# Patient Record
Sex: Female | Born: 1955 | Race: White | Hispanic: No | Smoking: Current every day smoker
Health system: Southern US, Community
[De-identification: ages and names within clinical notes are randomized; demographics above are authoritative.]

## PROBLEM LIST (undated history)

## (undated) DIAGNOSIS — F419 Anxiety disorder, unspecified: Secondary | ICD-10-CM

## (undated) DIAGNOSIS — R197 Diarrhea, unspecified: Secondary | ICD-10-CM

## (undated) DIAGNOSIS — Q899 Congenital malformation, unspecified: Secondary | ICD-10-CM

## (undated) DIAGNOSIS — M1990 Unspecified osteoarthritis, unspecified site: Secondary | ICD-10-CM

## (undated) DIAGNOSIS — J40 Bronchitis, not specified as acute or chronic: Secondary | ICD-10-CM

## (undated) HISTORY — PX: COLONOSCOPY W/ POLYPECTOMY: SHX1380

---

## 2013-05-05 DIAGNOSIS — J40 Bronchitis, not specified as acute or chronic: Secondary | ICD-10-CM

## 2013-05-05 HISTORY — DX: Bronchitis, not specified as acute or chronic: J40

## 2013-05-31 ENCOUNTER — Other Ambulatory Visit: Payer: Self-pay | Admitting: Orthopedic Surgery

## 2013-06-01 ENCOUNTER — Other Ambulatory Visit: Payer: Self-pay | Admitting: Orthopedic Surgery

## 2013-06-04 ENCOUNTER — Encounter (HOSPITAL_COMMUNITY): Payer: Self-pay | Admitting: Pharmacy Technician

## 2013-06-07 ENCOUNTER — Encounter (HOSPITAL_COMMUNITY)
Admission: RE | Admit: 2013-06-07 | Discharge: 2013-06-07 | Disposition: A | Discharge status: Home / Self Care | Payer: Federal, State, Local not specified - PPO | Source: Ambulatory Visit | Attending: Orthopedic Surgery | Admitting: Orthopedic Surgery

## 2013-06-07 ENCOUNTER — Encounter (HOSPITAL_COMMUNITY): Payer: Self-pay

## 2013-06-07 ENCOUNTER — Encounter (INDEPENDENT_AMBULATORY_CARE_PROVIDER_SITE_OTHER): Payer: Self-pay

## 2013-06-07 HISTORY — DX: Unspecified osteoarthritis, unspecified site: M19.90

## 2013-06-07 HISTORY — DX: Anxiety disorder, unspecified: F41.9

## 2013-06-07 HISTORY — DX: Bronchitis, not specified as acute or chronic: J40

## 2013-06-07 HISTORY — DX: Congenital malformation, unspecified: Q89.9

## 2013-06-07 HISTORY — DX: Diarrhea, unspecified: R19.7

## 2013-06-07 LAB — COMPREHENSIVE METABOLIC PANEL
ALBUMIN: 4.2 g/dL (ref 3.5–5.2)
ALK PHOS: 92 U/L (ref 39–117)
ALT: 20 U/L (ref 0–35)
AST: 23 U/L (ref 0–37)
BUN: 8 mg/dL (ref 6–23)
CO2: 20 mEq/L (ref 19–32)
Calcium: 9.7 mg/dL (ref 8.4–10.5)
Chloride: 101 mEq/L (ref 96–112)
Creatinine, Ser: 0.57 mg/dL (ref 0.50–1.10)
GFR calc non Af Amer: >90 mL/min (ref >90)
Glucose, Bld: 127 mg/dL — ABNORMAL HIGH (ref 70–99)
POTASSIUM: 4.5 meq/L (ref 3.7–5.3)
SODIUM: 139 meq/L (ref 137–147)
TOTAL PROTEIN: 8 g/dL (ref 6.0–8.3)
Total Bilirubin: 0.3 mg/dL (ref 0.3–1.2)

## 2013-06-07 LAB — PROTIME-INR
INR: 1.03 (ref 0.00–1.49)
Prothrombin Time: 13.3 seconds (ref 11.6–15.2)

## 2013-06-07 LAB — URINALYSIS, ROUTINE W REFLEX MICROSCOPIC
Bilirubin Urine: NEGATIVE
Glucose, UA: NEGATIVE mg/dL
Hgb urine dipstick: NEGATIVE
Ketones, ur: NEGATIVE mg/dL
Leukocytes, UA: NEGATIVE
NITRITE: NEGATIVE
Protein, ur: NEGATIVE mg/dL
SPECIFIC GRAVITY, URINE: 1.008 (ref 1.005–1.030)
UROBILINOGEN UA: 0.2 mg/dL (ref 0.0–1.0)
pH: 6.5 (ref 5.0–8.0)

## 2013-06-07 LAB — CBC WITH DIFFERENTIAL/PLATELET
Basophils Absolute: 0 10*3/uL (ref 0.0–0.1)
Basophils Relative: 0 % (ref 0–1)
EOS ABS: 0.1 10*3/uL (ref 0.0–0.7)
Eosinophils Relative: 1 % (ref 0–5)
HCT: 39.9 % (ref 36.0–46.0)
Hemoglobin: 13.7 g/dL (ref 12.0–15.0)
LYMPHS ABS: 1.9 10*3/uL (ref 0.7–4.0)
Lymphocytes Relative: 19 % (ref 12–46)
MCH: 32.4 pg (ref 26.0–34.0)
MCHC: 34.3 g/dL (ref 30.0–36.0)
MCV: 94.3 fL (ref 78.0–100.0)
Monocytes Absolute: 0.6 10*3/uL (ref 0.1–1.0)
Monocytes Relative: 6 % (ref 3–12)
NEUTROS ABS: 7.2 10*3/uL (ref 1.7–7.7)
NEUTROS PCT: 74 % (ref 43–77)
PLATELETS: 227 10*3/uL (ref 150–400)
RBC: 4.23 MIL/uL (ref 3.87–5.11)
RDW: 13.4 % (ref 11.5–15.5)
WBC: 9.9 10*3/uL (ref 4.0–10.5)

## 2013-06-07 LAB — TYPE AND SCREEN
ABO/RH(D): O POS
ANTIBODY SCREEN: NEGATIVE

## 2013-06-07 LAB — SURGICAL PCR SCREEN
MRSA, PCR: NEGATIVE
Staphylococcus aureus: NEGATIVE

## 2013-06-07 LAB — ABO/RH: ABO/RH(D): O POS

## 2013-06-07 LAB — APTT: aPTT: 27 seconds (ref 24–37)

## 2013-06-07 NOTE — Pre-Procedure Instructions (Signed)
Linda Rivera  06/07/2013   Your procedure is scheduled on:  Thursday Jun 10, 2013 at 7:30 AM.  [facility]"  Admitting at 5:30 AM.  Call this number if you have problems the morning of surgery: [phone]   Remember:   Do not eat food or drink liquids after midnight.   Take these medicines the morning of surgery with A SIP OF WATER: Clonazepam (Klonopin) if needed for anxiety, Gabapentin (Neurontin), Hydrocodone (Vicodin) if needed for pain.    Do not wear jewelry, make-up or nail polish.  Do not wear lotions, powders, or perfumes.   Do not shave 48 hours prior to surgery.   Do not bring valuables to the hospital.  [facility] is not responsible for any belongings or valuables.               Contacts, dentures or bridgework may not be worn into surgery.  Leave suitcase in the car. After surgery it may be brought to your room.  For patients admitted to the hospital, discharge time is determined by your treatment team.               Patients discharged the day of surgery will not be allowed to drive home.  Name and phone number of your driver: Family/Friend  Special Instructions: Shower the night before and the morning of your surgery using CHG soap   Please read over the following fact sheets that you were given: Pain Booklet, Coughing and Deep Breathing, Blood Transfusion Information, MRSA Information and Surgical Site Infection Prevention

## 2013-06-07 NOTE — Progress Notes (Signed)
Patient denied having any cardiac issues but informed Nurse that she had bronchitis approximately 2 weeks ago and has finished antibiotics. Patient denied having any shortness of breath. PCP is Corinna CapraAngel Brown in [place].

## 2013-06-09 MED ORDER — CEFAZOLIN SODIUM-DEXTROSE 2-3 GM-% IV SOLR
2.0000 g | INTRAVENOUS | Status: AC
  Administered 2013-06-10: 2 g via INTRAVENOUS

## 2013-06-09 MED ORDER — POVIDONE-IODINE 7.5 % EX SOLN
Freq: Once | CUTANEOUS | Status: DC
  Filled 2013-06-09: qty 118

## 2013-06-09 NOTE — H&P (Signed)
PREOPERATIVE H&P  Chief Complaint: left leg pain  HPI: Linda EhlersMary Rivera is a 58 y.o. female who presents with ongoing pain the left leg x close to 1 year. 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 Given the patient ongoing pain and disability, she did elect to proceed with a, L4/5 decompression and fusion.   Past Medical History  Diagnosis Date  . Bronchitis April 2015  . Arthritis   . Anxiety   . Diarrhea   . Birth defect     Right knee   Past Surgical History  Procedure Laterality Date  . Colonoscopy w/ polypectomy     History   Social History  . Marital Status: Widowed    Spouse Name: N/A    Number of Children: N/A  . Years of Education: N/A   Social History Main Topics  . Smoking status: Current Every Day Smoker -- 1.00 packs/day for 26 years    Types: Cigarettes  . Smokeless tobacco: Not on file  . Alcohol Use: No  . Drug Use: No  . Sexual Activity: Not on file   Other Topics Concern  . Not on file   Social History Narrative  . No narrative on file   No family history on file. Allergies  Allergen Reactions  . Darvon [Propoxyphene] Hives   Prior to Admission medications   Medication Sig Start Date End Date Taking? Authorizing Provider  aspirin 81 MG chewable tablet Chew 81 mg by mouth every other day.   Yes Historical Provider, MD  clonazePAM (KLONOPIN) 0.5 MG tablet Take 0.5 mg by mouth 2 (two) times daily as needed for anxiety.   Yes Historical Provider, MD  diphenoxylate-atropine (LOMOTIL) 2.5-0.025 MG per tablet Take 1 tablet by mouth 4 (four) times daily as needed for diarrhea or loose stools.   Yes Historical Provider, MD  gabapentin (NEURONTIN) 300 MG capsule Take 300 mg by mouth 3 (three) times daily.   Yes Historical Provider, MD  HYDROcodone-acetaminophen (NORCO/VICODIN) 5-325 MG per tablet Take 1 tablet by mouth every 4 (four) hours as needed for moderate pain.   Yes Historical Provider, MD   phenyltoloxamine-acetaminophen 30-325 MG per tablet Take 1 tablet by mouth 2 (two) times daily.   Yes Historical Provider, MD  sertraline (ZOLOFT) 25 MG tablet Take 25 mg by mouth daily.   Yes Historical Provider, MD     All other systems have been reviewed and were otherwise negative with the exception of those mentioned in the HPI and as above.  Physical Exam: There were no vitals filed for this visit.  General: Alert, no acute distress Cardiovascular: No pedal edema Respiratory: No cyanosis, no use of accessory musculature GI: No organomegaly, abdomen is soft and non-tender Skin: No lesions in the area of chief complaint Neurologic: Sensation intact distally Psychiatric: Patient is competent for consent with normal mood and affect Lymphatic: No axillary or cervical lymphadenopathy  MUSCULOSKELETAL: + SLR on left  Assessment/Plan: Left leg pain Plan for Procedure(s): POSTERIOR LUMBAR DECOMPRESSION AND FUSION 1 LEVEL   Emilee HeroMark Leonard Felise Georgia, MD 06/09/2013 4:21 PM

## 2013-06-10 ENCOUNTER — Inpatient Hospital Stay (HOSPITAL_COMMUNITY): Payer: Federal, State, Local not specified - PPO

## 2013-06-10 ENCOUNTER — Encounter (HOSPITAL_COMMUNITY): Payer: Self-pay | Admitting: *Deleted

## 2013-06-10 ENCOUNTER — Encounter (HOSPITAL_COMMUNITY): Payer: Federal, State, Local not specified - PPO | Admitting: Certified Registered Nurse Anesthetist

## 2013-06-10 ENCOUNTER — Encounter (HOSPITAL_COMMUNITY)
Admission: RE | Disposition: A | Discharge status: Home / Self Care | Payer: Federal, State, Local not specified - PPO | Source: Ambulatory Visit | Attending: Orthopedic Surgery

## 2013-06-10 ENCOUNTER — Inpatient Hospital Stay (HOSPITAL_COMMUNITY)
Admission: RE | Admit: 2013-06-10 | Discharge: 2013-06-12 | DRG: 460 | Disposition: A | Discharge status: Home / Self Care | Payer: Federal, State, Local not specified - PPO | Source: Ambulatory Visit | Attending: Orthopedic Surgery | Admitting: Orthopedic Surgery

## 2013-06-10 ENCOUNTER — Inpatient Hospital Stay (HOSPITAL_COMMUNITY): Payer: Federal, State, Local not specified - PPO | Admitting: Certified Registered Nurse Anesthetist

## 2013-06-10 DIAGNOSIS — Z01812 Encounter for preprocedural laboratory examination: Secondary | ICD-10-CM

## 2013-06-10 DIAGNOSIS — G8918 Other acute postprocedural pain: Secondary | ICD-10-CM | POA: Diagnosis not present

## 2013-06-10 DIAGNOSIS — M5410 Radiculopathy, site unspecified: Secondary | ICD-10-CM | POA: Diagnosis present

## 2013-06-10 DIAGNOSIS — F411 Generalized anxiety disorder: Secondary | ICD-10-CM | POA: Diagnosis present

## 2013-06-10 DIAGNOSIS — F17200 Nicotine dependence, unspecified, uncomplicated: Secondary | ICD-10-CM | POA: Diagnosis present

## 2013-06-10 DIAGNOSIS — Q762 Congenital spondylolisthesis: Principal | ICD-10-CM

## 2013-06-10 SURGERY — POSTERIOR LUMBAR FUSION 1 LEVEL
Anesthesia: General | Site: Back | Laterality: Left

## 2013-06-10 MED ORDER — OXYCODONE-ACETAMINOPHEN 5-325 MG PO TABS
1.0000 | ORAL_TABLET | ORAL | Status: DC | PRN
  Administered 2013-06-11 – 2013-06-12 (×4): 2 via ORAL
  Filled 2013-06-10: qty 2
  Filled 2013-06-10: qty 1
  Filled 2013-06-10 (×3): qty 2

## 2013-06-10 MED ORDER — OXYCODONE HCL 5 MG/5ML PO SOLN: 5.0000 mg | Freq: Once | ORAL | Status: AC | PRN

## 2013-06-10 MED ORDER — FENTANYL CITRATE 0.05 MG/ML IJ SOLN
INTRAMUSCULAR | Status: AC
  Filled 2013-06-10: qty 5

## 2013-06-10 MED ORDER — ONDANSETRON HCL 4 MG/2ML IJ SOLN: 4.0000 mg | Freq: Once | INTRAMUSCULAR | Status: DC | PRN

## 2013-06-10 MED ORDER — SUCCINYLCHOLINE CHLORIDE 20 MG/ML IJ SOLN
INTRAMUSCULAR | Status: AC
  Filled 2013-06-10: qty 1

## 2013-06-10 MED ORDER — BUPIVACAINE-EPINEPHRINE (PF) 0.25% -1:200000 IJ SOLN
INTRAMUSCULAR | Status: AC
  Filled 2013-06-10: qty 30

## 2013-06-10 MED ORDER — BUPIVACAINE-EPINEPHRINE 0.25% -1:200000 IJ SOLN
INTRAMUSCULAR | Status: DC | PRN
  Administered 2013-06-10: 27 mL

## 2013-06-10 MED ORDER — PHENYLEPHRINE HCL 10 MG/ML IJ SOLN
INTRAMUSCULAR | Status: DC | PRN
  Administered 2013-06-10 (×3): 160 ug via INTRAVENOUS

## 2013-06-10 MED ORDER — EPHEDRINE SULFATE 50 MG/ML IJ SOLN
INTRAMUSCULAR | Status: DC | PRN
  Administered 2013-06-10: 15 mg via INTRAVENOUS
  Administered 2013-06-10: 10 mg via INTRAVENOUS

## 2013-06-10 MED ORDER — DIPHENHYDRAMINE HCL 12.5 MG/5ML PO ELIX: 12.5000 mg | ORAL_SOLUTION | Freq: Four times a day (QID) | ORAL | Status: DC | PRN

## 2013-06-10 MED ORDER — MENTHOL 3 MG MT LOZG: 1.0000 | LOZENGE | OROMUCOSAL | Status: DC | PRN

## 2013-06-10 MED ORDER — OXYCODONE HCL 5 MG PO TABS
5.0000 mg | ORAL_TABLET | Freq: Once | ORAL | Status: AC | PRN
  Administered 2013-06-10: 5 mg via ORAL

## 2013-06-10 MED ORDER — NEOSTIGMINE METHYLSULFATE 10 MG/10ML IV SOLN
INTRAVENOUS | Status: DC | PRN
  Administered 2013-06-10: 3 mg via INTRAVENOUS

## 2013-06-10 MED ORDER — KETOROLAC TROMETHAMINE 30 MG/ML IJ SOLN
INTRAMUSCULAR | Status: AC
  Filled 2013-06-10: qty 1

## 2013-06-10 MED ORDER — CEFAZOLIN SODIUM 1-5 GM-% IV SOLN
1.0000 g | Freq: Three times a day (TID) | INTRAVENOUS | Status: AC
  Administered 2013-06-10 – 2013-06-11 (×2): 1 g via INTRAVENOUS
  Filled 2013-06-10 (×2): qty 50

## 2013-06-10 MED ORDER — METHYLENE BLUE 1 % INJ SOLN
INTRAMUSCULAR | Status: AC
  Filled 2013-06-10: qty 10

## 2013-06-10 MED ORDER — THROMBIN 20000 UNITS EX SOLR
CUTANEOUS | Status: DC | PRN
  Administered 2013-06-10: 09:00:00 via TOPICAL

## 2013-06-10 MED ORDER — ALBUMIN HUMAN 5 % IV SOLN
INTRAVENOUS | Status: DC | PRN
  Administered 2013-06-10 (×2): via INTRAVENOUS

## 2013-06-10 MED ORDER — HYDROMORPHONE HCL PF 1 MG/ML IJ SOLN
INTRAMUSCULAR | Status: AC
  Filled 2013-06-10: qty 1

## 2013-06-10 MED ORDER — PROPOFOL 10 MG/ML IV BOLUS
INTRAVENOUS | Status: DC | PRN
  Administered 2013-06-10: 40 mg via INTRAVENOUS
  Administered 2013-06-10: 160 mg via INTRAVENOUS

## 2013-06-10 MED ORDER — MORPHINE SULFATE (PF) 1 MG/ML IV SOLN
INTRAVENOUS | Status: AC
  Filled 2013-06-10: qty 25

## 2013-06-10 MED ORDER — THROMBIN 20000 UNITS EX SOLR
CUTANEOUS | Status: AC
  Filled 2013-06-10: qty 20000

## 2013-06-10 MED ORDER — SODIUM CHLORIDE 0.9 % IJ SOLN
3.0000 mL | Freq: Two times a day (BID) | INTRAMUSCULAR | Status: DC
  Administered 2013-06-11 (×2): 3 mL via INTRAVENOUS

## 2013-06-10 MED ORDER — BISACODYL 5 MG PO TBEC: 5.0000 mg | DELAYED_RELEASE_TABLET | Freq: Every day | ORAL | Status: DC | PRN

## 2013-06-10 MED ORDER — GLYCOPYRROLATE 0.2 MG/ML IJ SOLN
INTRAMUSCULAR | Status: DC | PRN
  Administered 2013-06-10: 0.6 mg via INTRAVENOUS

## 2013-06-10 MED ORDER — PHENOL 1.4 % MT LIQD: 1.0000 | OROMUCOSAL | Status: DC | PRN

## 2013-06-10 MED ORDER — LIDOCAINE HCL (CARDIAC) 20 MG/ML IV SOLN
INTRAVENOUS | Status: DC | PRN
  Administered 2013-06-10: 100 mg via INTRAVENOUS

## 2013-06-10 MED ORDER — METHYLENE BLUE 1 % INJ SOLN
INTRAMUSCULAR | Status: DC | PRN
  Administered 2013-06-10: 1 mL

## 2013-06-10 MED ORDER — SERTRALINE HCL 25 MG PO TABS
25.0000 mg | ORAL_TABLET | Freq: Every day | ORAL | Status: DC
  Administered 2013-06-11 – 2013-06-12 (×2): 25 mg via ORAL
  Filled 2013-06-10 (×3): qty 1

## 2013-06-10 MED ORDER — SENNOSIDES-DOCUSATE SODIUM 8.6-50 MG PO TABS: 1.0000 | ORAL_TABLET | Freq: Every evening | ORAL | Status: DC | PRN

## 2013-06-10 MED ORDER — ROCURONIUM BROMIDE 50 MG/5ML IV SOLN
INTRAVENOUS | Status: AC
  Filled 2013-06-10: qty 1

## 2013-06-10 MED ORDER — MIDAZOLAM HCL 2 MG/2ML IJ SOLN
INTRAMUSCULAR | Status: AC
  Filled 2013-06-10: qty 2

## 2013-06-10 MED ORDER — ONDANSETRON HCL 4 MG/2ML IJ SOLN: 4.0000 mg | INTRAMUSCULAR | Status: DC | PRN

## 2013-06-10 MED ORDER — 0.9 % SODIUM CHLORIDE (POUR BTL) OPTIME
TOPICAL | Status: DC | PRN
  Administered 2013-06-10: 1000 mL

## 2013-06-10 MED ORDER — MORPHINE SULFATE (PF) 1 MG/ML IV SOLN
INTRAVENOUS | Status: DC
  Administered 2013-06-10: 3 mg via INTRAVENOUS
  Administered 2013-06-10: 14:00:00 via INTRAVENOUS
  Administered 2013-06-11: 4.5 mg via INTRAVENOUS
  Administered 2013-06-11: 6 mg via INTRAVENOUS

## 2013-06-10 MED ORDER — CLONAZEPAM 0.5 MG PO TABS: 0.5000 mg | ORAL_TABLET | Freq: Two times a day (BID) | ORAL | Status: DC | PRN

## 2013-06-10 MED ORDER — DOCUSATE SODIUM 100 MG PO CAPS
100.0000 mg | ORAL_CAPSULE | Freq: Two times a day (BID) | ORAL | Status: DC
  Administered 2013-06-10 – 2013-06-12 (×4): 100 mg via ORAL
  Filled 2013-06-10 (×6): qty 1

## 2013-06-10 MED ORDER — LACTATED RINGERS IV SOLN
INTRAVENOUS | Status: DC | PRN
  Administered 2013-06-10 (×3): via INTRAVENOUS

## 2013-06-10 MED ORDER — DIPHENHYDRAMINE HCL 50 MG/ML IJ SOLN: 12.5000 mg | Freq: Four times a day (QID) | INTRAMUSCULAR | Status: DC | PRN

## 2013-06-10 MED ORDER — PROPOFOL INFUSION 10 MG/ML OPTIME
INTRAVENOUS | Status: DC | PRN
  Administered 2013-06-10: 50 ug/kg/min via INTRAVENOUS

## 2013-06-10 MED ORDER — PHENYLEPHRINE 40 MCG/ML (10ML) SYRINGE FOR IV PUSH (FOR BLOOD PRESSURE SUPPORT)
PREFILLED_SYRINGE | INTRAVENOUS | Status: AC
  Filled 2013-06-10: qty 10

## 2013-06-10 MED ORDER — ONDANSETRON HCL 4 MG/2ML IJ SOLN: 4.0000 mg | Freq: Four times a day (QID) | INTRAMUSCULAR | Status: DC | PRN

## 2013-06-10 MED ORDER — ACETAMINOPHEN 325 MG PO TABS: 650.0000 mg | ORAL_TABLET | ORAL | Status: DC | PRN

## 2013-06-10 MED ORDER — SODIUM CHLORIDE 0.9 % IV SOLN
INTRAVENOUS | Status: DC | PRN
  Administered 2013-06-10: 11:00:00 via INTRAVENOUS

## 2013-06-10 MED ORDER — PROPOFOL 10 MG/ML IV BOLUS
INTRAVENOUS | Status: AC
  Filled 2013-06-10: qty 20

## 2013-06-10 MED ORDER — HYDROMORPHONE HCL PF 1 MG/ML IJ SOLN
0.2500 mg | INTRAMUSCULAR | Status: DC | PRN
  Administered 2013-06-10 (×2): 0.5 mg via INTRAVENOUS

## 2013-06-10 MED ORDER — DIAZEPAM 5 MG PO TABS: 5.0000 mg | ORAL_TABLET | Freq: Four times a day (QID) | ORAL | Status: DC | PRN

## 2013-06-10 MED ORDER — SODIUM CHLORIDE 0.9 % IJ SOLN: 9.0000 mL | INTRAMUSCULAR | Status: DC | PRN

## 2013-06-10 MED ORDER — MIDAZOLAM HCL 5 MG/5ML IJ SOLN
INTRAMUSCULAR | Status: DC | PRN
  Administered 2013-06-10: 2 mg via INTRAVENOUS

## 2013-06-10 MED ORDER — ALUM & MAG HYDROXIDE-SIMETH 200-200-20 MG/5ML PO SUSP: 30.0000 mL | Freq: Four times a day (QID) | ORAL | Status: DC | PRN

## 2013-06-10 MED ORDER — FENTANYL CITRATE 0.05 MG/ML IJ SOLN
INTRAMUSCULAR | Status: DC | PRN
  Administered 2013-06-10: 50 ug via INTRAVENOUS
  Administered 2013-06-10: 200 ug via INTRAVENOUS
  Administered 2013-06-10: 25 ug via INTRAVENOUS
  Administered 2013-06-10: 50 ug via INTRAVENOUS
  Administered 2013-06-10: 25 ug via INTRAVENOUS

## 2013-06-10 MED ORDER — SODIUM CHLORIDE 0.9 % IJ SOLN
INTRAMUSCULAR | Status: AC
  Filled 2013-06-10: qty 10

## 2013-06-10 MED ORDER — ACETAMINOPHEN 325 MG PO TABS: 325.0000 mg | ORAL_TABLET | ORAL | Status: DC | PRN

## 2013-06-10 MED ORDER — ONDANSETRON HCL 4 MG/2ML IJ SOLN
INTRAMUSCULAR | Status: AC
  Filled 2013-06-10: qty 2

## 2013-06-10 MED ORDER — ACETAMINOPHEN 160 MG/5ML PO SOLN
325.0000 mg | ORAL | Status: DC | PRN
  Filled 2013-06-10: qty 20.3

## 2013-06-10 MED ORDER — NALOXONE HCL 0.4 MG/ML IJ SOLN: 0.4000 mg | INTRAMUSCULAR | Status: DC | PRN

## 2013-06-10 MED ORDER — LIDOCAINE HCL (CARDIAC) 20 MG/ML IV SOLN
INTRAVENOUS | Status: AC
  Filled 2013-06-10: qty 5

## 2013-06-10 MED ORDER — ONDANSETRON HCL 4 MG/2ML IJ SOLN
INTRAMUSCULAR | Status: DC | PRN
  Administered 2013-06-10: 4 mg via INTRAVENOUS

## 2013-06-10 MED ORDER — GABAPENTIN 300 MG PO CAPS
300.0000 mg | ORAL_CAPSULE | Freq: Three times a day (TID) | ORAL | Status: DC
  Administered 2013-06-10 – 2013-06-12 (×6): 300 mg via ORAL
  Filled 2013-06-10 (×8): qty 1

## 2013-06-10 MED ORDER — SODIUM CHLORIDE 0.9 % IV SOLN
INTRAVENOUS | Status: DC
  Administered 2013-06-10: 19:00:00 via INTRAVENOUS

## 2013-06-10 MED ORDER — EPHEDRINE SULFATE 50 MG/ML IJ SOLN
INTRAMUSCULAR | Status: AC
  Filled 2013-06-10: qty 1

## 2013-06-10 MED ORDER — DIPHENOXYLATE-ATROPINE 2.5-0.025 MG PO TABS: 1.0000 | ORAL_TABLET | Freq: Four times a day (QID) | ORAL | Status: DC | PRN

## 2013-06-10 MED ORDER — FLEET ENEMA 7-19 GM/118ML RE ENEM: 1.0000 | ENEMA | Freq: Once | RECTAL | Status: AC | PRN

## 2013-06-10 MED ORDER — SODIUM CHLORIDE 0.9 % IV SOLN: 250.0000 mL | INTRAVENOUS | Status: DC

## 2013-06-10 MED ORDER — SODIUM CHLORIDE 0.9 % IJ SOLN: 3.0000 mL | INTRAMUSCULAR | Status: DC | PRN

## 2013-06-10 MED ORDER — MORPHINE SULFATE 2 MG/ML IJ SOLN: 1.0000 mg | INTRAMUSCULAR | Status: DC | PRN

## 2013-06-10 MED ORDER — ACETAMINOPHEN 650 MG RE SUPP
650.0000 mg | RECTAL | Status: DC | PRN
Start: 2013-06-10 — End: 2013-06-12

## 2013-06-10 MED ORDER — ROCURONIUM BROMIDE 100 MG/10ML IV SOLN
INTRAVENOUS | Status: DC | PRN
  Administered 2013-06-10: 10 mg via INTRAVENOUS
  Administered 2013-06-10: 40 mg via INTRAVENOUS

## 2013-06-10 MED ORDER — OXYCODONE HCL 5 MG PO TABS
ORAL_TABLET | ORAL | Status: AC
  Filled 2013-06-10: qty 1

## 2013-06-10 MED ORDER — KETOROLAC TROMETHAMINE 30 MG/ML IJ SOLN
15.0000 mg | Freq: Once | INTRAMUSCULAR | Status: AC | PRN
  Administered 2013-06-10: 30 mg via INTRAVENOUS

## 2013-06-10 SURGICAL SUPPLY — 85 items
BENZOIN TINCTURE PRP APPL 2/3 (GAUZE/BANDAGES/DRESSINGS) ×3 IMPLANT
BLADE 10 SAFETY STRL DISP (BLADE) ×3 IMPLANT
BLADE SURG ROTATE 9660 (MISCELLANEOUS) IMPLANT
BUR ROUND PRECISION 4.0 (BURR) ×2 IMPLANT
BUR ROUND PRECISION 4.0MM (BURR) ×1
CAGE CONCORDE BULLET 9X10X27 (Cage) ×3 IMPLANT
CARTRIDGE OIL MAESTRO DRILL (MISCELLANEOUS) ×1 IMPLANT
CLOSURE STERI-STRIP 1/2X4 (GAUZE/BANDAGES/DRESSINGS) ×1
CLOSURE WOUND 1/2 X4 (GAUZE/BANDAGES/DRESSINGS) ×2
CLSR STERI-STRIP ANTIMIC 1/2X4 (GAUZE/BANDAGES/DRESSINGS) ×2 IMPLANT
CONT SPEC STER OR (MISCELLANEOUS) ×3 IMPLANT
CORDS BIPOLAR (ELECTRODE) ×3 IMPLANT
COVER SURGICAL LIGHT HANDLE (MISCELLANEOUS) ×3 IMPLANT
DIFFUSER DRILL AIR PNEUMATIC (MISCELLANEOUS) ×3 IMPLANT
DRAIN CHANNEL 15F RND FF W/TCR (WOUND CARE) IMPLANT
DRAPE C-ARM 42X72 X-RAY (DRAPES) ×3 IMPLANT
DRAPE ORTHO SPLIT 77X108 STRL (DRAPES) ×2
DRAPE POUCH INSTRU U-SHP 10X18 (DRAPES) ×3 IMPLANT
DRAPE SURG 17X23 STRL (DRAPES) ×9 IMPLANT
DRAPE SURG ORHT 6 SPLT 77X108 (DRAPES) ×1 IMPLANT
DURAPREP 26ML APPLICATOR (WOUND CARE) ×3 IMPLANT
ELECT BLADE 4.0 EZ CLEAN MEGAD (MISCELLANEOUS) ×3
ELECT CAUTERY BLADE 6.4 (BLADE) ×3 IMPLANT
ELECT REM PT RETURN 9FT ADLT (ELECTROSURGICAL) ×3
ELECTRODE BLDE 4.0 EZ CLN MEGD (MISCELLANEOUS) ×1 IMPLANT
ELECTRODE REM PT RTRN 9FT ADLT (ELECTROSURGICAL) ×1 IMPLANT
EVACUATOR SILICONE 100CC (DRAIN) IMPLANT
GAUZE SPONGE 4X4 16PLY XRAY LF (GAUZE/BANDAGES/DRESSINGS) ×12 IMPLANT
GLOVE BIO SURGEON STRL SZ7 (GLOVE) ×3 IMPLANT
GLOVE BIO SURGEON STRL SZ8 (GLOVE) ×3 IMPLANT
GLOVE BIOGEL PI IND STRL 7.0 (GLOVE) ×1 IMPLANT
GLOVE BIOGEL PI IND STRL 8 (GLOVE) ×1 IMPLANT
GLOVE BIOGEL PI INDICATOR 7.0 (GLOVE) ×2
GLOVE BIOGEL PI INDICATOR 8 (GLOVE) ×2
GOWN STRL REUS W/ TWL LRG LVL3 (GOWN DISPOSABLE) ×2 IMPLANT
GOWN STRL REUS W/ TWL XL LVL3 (GOWN DISPOSABLE) ×1 IMPLANT
GOWN STRL REUS W/TWL LRG LVL3 (GOWN DISPOSABLE) ×4
GOWN STRL REUS W/TWL XL LVL3 (GOWN DISPOSABLE) ×2
IV CATH 14GX2 1/4 (CATHETERS) ×3 IMPLANT
KIT BASIN OR (CUSTOM PROCEDURE TRAY) ×3 IMPLANT
KIT POSITION SURG JACKSON T1 (MISCELLANEOUS) ×3 IMPLANT
KIT ROOM TURNOVER OR (KITS) ×3 IMPLANT
MARKER SKIN DUAL TIP RULER LAB (MISCELLANEOUS) ×3 IMPLANT
MIX DBX 10CC 35% BONE (Bone Implant) ×3 IMPLANT
NDL SAFETY ECLIPSE 18X1.5 (NEEDLE) ×1 IMPLANT
NEEDLE 22X1 1/2 (OR ONLY) (NEEDLE) ×3 IMPLANT
NEEDLE BONE MARROW 8GX6 FENEST (NEEDLE) IMPLANT
NEEDLE HYPO 18GX1.5 SHARP (NEEDLE) ×2
NEEDLE HYPO 25GX1X1/2 BEV (NEEDLE) ×3 IMPLANT
NEEDLE SPNL 18GX3.5 QUINCKE PK (NEEDLE) ×6 IMPLANT
NEURO MONITORING STIM (LABOR (TRAVEL & OVERTIME)) ×3 IMPLANT
NS IRRIG 1000ML POUR BTL (IV SOLUTION) ×3 IMPLANT
OIL CARTRIDGE MAESTRO DRILL (MISCELLANEOUS) ×3
PACK LAMINECTOMY ORTHO (CUSTOM PROCEDURE TRAY) ×3 IMPLANT
PACK UNIVERSAL I (CUSTOM PROCEDURE TRAY) ×3 IMPLANT
PAD ARMBOARD 7.5X6 YLW CONV (MISCELLANEOUS) ×6 IMPLANT
PATTIES SURGICAL .5 X1 (DISPOSABLE) ×3 IMPLANT
PATTIES SURGICAL .5X1.5 (GAUZE/BANDAGES/DRESSINGS) ×3 IMPLANT
ROD PRE BENT EXP 40MM (Rod) ×3 IMPLANT
ROD PRE BENT EXPEDIUM 35MM (Rod) ×3 IMPLANT
SCREW EXPEDIUM POLYAXIAL 7X40M (Screw) ×6 IMPLANT
SCREW POLYAXIAL 7X45MM (Screw) ×6 IMPLANT
SCREW SET SINGLE INNER (Screw) ×12 IMPLANT
SPONGE GAUZE 4X4 12PLY (GAUZE/BANDAGES/DRESSINGS) ×3 IMPLANT
SPONGE GAUZE 4X4 12PLY STER LF (GAUZE/BANDAGES/DRESSINGS) ×3 IMPLANT
SPONGE INTESTINAL PEANUT (DISPOSABLE) ×3 IMPLANT
SPONGE SURGIFOAM ABS GEL 100 (HEMOSTASIS) ×3 IMPLANT
STRIP CLOSURE SKIN 1/2X4 (GAUZE/BANDAGES/DRESSINGS) ×4 IMPLANT
SURGIFLO TRUKIT (HEMOSTASIS) IMPLANT
SUT MNCRL AB 4-0 PS2 18 (SUTURE) ×6 IMPLANT
SUT VIC AB 0 CT1 18XCR BRD 8 (SUTURE) ×1 IMPLANT
SUT VIC AB 0 CT1 8-18 (SUTURE) ×2
SUT VIC AB 1 CT1 18XCR BRD 8 (SUTURE) ×2 IMPLANT
SUT VIC AB 1 CT1 8-18 (SUTURE) ×4
SUT VIC AB 2-0 CT2 18 VCP726D (SUTURE) ×3 IMPLANT
SYR 20CC LL (SYRINGE) ×3 IMPLANT
SYR BULB IRRIGATION 50ML (SYRINGE) ×3 IMPLANT
SYR CONTROL 10ML LL (SYRINGE) ×3 IMPLANT
SYR TB 1ML LUER SLIP (SYRINGE) ×3 IMPLANT
TAPE CLOTH SURG 6X10 WHT LF (GAUZE/BANDAGES/DRESSINGS) ×3 IMPLANT
TOWEL OR 17X24 6PK STRL BLUE (TOWEL DISPOSABLE) ×3 IMPLANT
TOWEL OR 17X26 10 PK STRL BLUE (TOWEL DISPOSABLE) ×3 IMPLANT
TRAY FOLEY CATH 16FRSI W/METER (SET/KITS/TRAYS/PACK) ×3 IMPLANT
WATER STERILE IRR 1000ML POUR (IV SOLUTION) ×3 IMPLANT
YANKAUER SUCT BULB TIP NO VENT (SUCTIONS) ×3 IMPLANT

## 2013-06-10 NOTE — Plan of Care (Signed)
Problem: Consults Goal: Diagnosis - Spinal Surgery Thoraco/Lumbar Spine Fusion L4-5 Lumbar Fusion

## 2013-06-10 NOTE — Anesthesia Preprocedure Evaluation (Addendum)
Anesthesia Evaluation  Patient identified by MRN, date of birth, ID band Patient awake    Reviewed: Allergy & Precautions, H&P , NPO status , Patient's Chart, lab work & pertinent test results  Airway Mallampati: II TM Distance: >3 FB Neck ROM: Full    Dental  (+) Edentulous Upper, Edentulous Lower   Pulmonary neg sleep apnea, neg COPDCurrent Smoker,  breath sounds clear to auscultation        Cardiovascular negative cardio ROS  Rhythm:Regular     Neuro/Psych PSYCHIATRIC DISORDERS Anxiety Depression Low back pain with left sided symptoms    GI/Hepatic negative GI ROS, Neg liver ROS,   Endo/Other  negative endocrine ROS  Renal/GU negative Renal ROS     Musculoskeletal negative musculoskeletal ROS (+)   Abdominal   Peds  Hematology negative hematology ROS (+)   Anesthesia Other Findings   Reproductive/Obstetrics                          Anesthesia Physical Anesthesia Plan  ASA: II  Anesthesia Plan: General   Post-op Pain Management:    Induction: Intravenous  Airway Management Planned: Oral ETT  Additional Equipment: None  Intra-op Plan:   Post-operative Plan: Extubation in OR  Informed Consent: I have reviewed the patients History and Physical, chart, labs and discussed the procedure including the risks, benefits and alternatives for the proposed anesthesia with the patient or authorized representative who has indicated his/her understanding and acceptance.   Dental advisory given  Plan Discussed with: CRNA, Surgeon and Anesthesiologist  Anesthesia Plan Comments:        Anesthesia Quick Evaluation

## 2013-06-10 NOTE — Anesthesia Postprocedure Evaluation (Signed)
  Anesthesia Post-op Note  Patient: Linda Rivera  Procedure(s) Performed: Procedure(s) with comments: POSTERIOR LUMBAR FUSION 1 LEVEL (Left) - Left sided lumbar 4-5 transforaminal lumbar interbody fusion with instrumentation and allograft  Patient Location: PACU  Anesthesia Type:General  Level of Consciousness: awake and alert   Airway and Oxygen Therapy: Patient Spontanous Breathing and Patient connected to nasal cannula oxygen  Post-op Pain: moderate  Post-op Assessment: Post-op Vital signs reviewed, Patient's Cardiovascular Status Stable, Respiratory Function Stable, Patent Airway, No signs of Nausea or vomiting and Pain level controlled  Post-op Vital Signs: Reviewed and stable  Last Vitals:  Filed Vitals:   06/10/13 1317  BP: 127/58  Pulse: 83  Temp: 36.7 C  Resp: 18    Complications: No apparent anesthesia complications

## 2013-06-10 NOTE — Transfer of Care (Signed)
Immediate Anesthesia Transfer of Care Note  Patient: Linda EhlersMary Bogus  Procedure(s) Performed: Procedure(s) with comments: POSTERIOR LUMBAR FUSION 1 LEVEL (Left) - Left sided lumbar 4-5 transforaminal lumbar interbody fusion with instrumentation and allograft  Patient Location: PACU  Anesthesia Type:General  Level of Consciousness: awake, alert , oriented and patient cooperative  Airway & Oxygen Therapy: Patient Spontanous Breathing and Patient connected to nasal cannula oxygen  Post-op Assessment: Report given to PACU RN, Post -op Vital signs reviewed and stable and Patient moving all extremities X 4  Post vital signs: Reviewed and stable  Complications: No apparent anesthesia complications

## 2013-06-10 NOTE — Anesthesia Procedure Notes (Signed)
Procedure Name: Intubation Date/Time: 06/10/2013 7:37 AM Performed by: Rogelia BogaMUELLER, Cheryllynn Sarff P Pre-anesthesia Checklist: Patient identified, Emergency Drugs available, Suction available, Patient being monitored and Timeout performed Patient Re-evaluated:Patient Re-evaluated prior to inductionOxygen Delivery Method: Circle system utilized Preoxygenation: Pre-oxygenation with 100% oxygen Intubation Type: IV induction Ventilation: Mask ventilation without difficulty and Oral airway inserted - appropriate to patient size Laryngoscope Size: Mac and 4 Grade View: Grade II Tube type: Oral Tube size: 7.5 mm Number of attempts: 1 Airway Equipment and Method: Stylet Placement Confirmation: ETT inserted through vocal cords under direct vision,  positive ETCO2 and breath sounds checked- equal and bilateral Secured at: 21 cm Tube secured with: Tape Dental Injury: Teeth and Oropharynx as per pre-operative assessment

## 2013-06-10 NOTE — Progress Notes (Signed)
Utilization review completed. Senica Crall, RN, BSN. 

## 2013-06-11 ENCOUNTER — Encounter (HOSPITAL_COMMUNITY): Payer: Self-pay | Admitting: Orthopedic Surgery

## 2013-06-11 MED ORDER — GUAIFENESIN-CODEINE 100-10 MG/5ML PO SOLN
5.0000 mL | ORAL | Status: DC | PRN
  Administered 2013-06-11 – 2013-06-12 (×2): 5 mL via ORAL
  Filled 2013-06-11 (×2): qty 5

## 2013-06-11 MED FILL — Sodium Chloride IV Soln 0.9%: INTRAVENOUS | Qty: 2000 | Status: AC

## 2013-06-11 NOTE — Progress Notes (Signed)
Occupational Therapy Evaluation Patient Details Name: Linda Rivera MRN: [id number] DOB: May 17, 1955 Today's Date: 06/11/2013    History of Present Illness Pt is 58 y.o Female s/p posterior lumbar funsion 1 level for radiculopathy.    Clinical Impression   PTA pt lived at home alone and was independent with ADLs and functional mobility. Pt with good mobility today and at Min guard level for tub transfer. Education and training completed with pt and boyfriend for compensatory techniques and AE for LB ADLs and incorporating back precautions into ADLs. Pt recalled 3/3 back precautions. No further acute OT needs.     Follow Up Recommendations  No OT follow up;Supervision/Assistance - 24 hour    Equipment Recommendations  3 in 1 bedside comode       Precautions / Restrictions Precautions Precautions: Back Precaution Booklet Issued: Yes (comment) Precaution Comments: Educated pt and boyfriend on back precautions and incorporating into ADLs.  Restrictions Weight Bearing Restrictions: No      Mobility Bed Mobility               General bed mobility comments: No assessed- pt in recliner at start and end of session. Reviewed log roll technique with pt and boyfriend.  Transfers Overall transfer level: Needs assistance Equipment used: Rolling walker (2 wheeled) Transfers: Sit to/from Stand Sit to Stand: Min guard         General transfer comment: Pt with good technique and hand placement    Balance Overall balance assessment: Modified Independent                                          ADL Overall ADL's : Needs assistance/impaired Eating/Feeding: Independent;Sitting   Grooming: Supervision/safety;Standing   Upper Body Bathing: Set up;Sitting (with max A to remove TLSO)   Lower Body Bathing: Minimal assistance;Sit to/from stand   Upper Body Dressing : Maximal assistance;Sitting (including TLSO brace)   Lower Body Dressing: Sit to/from  stand;Moderate assistance   Toilet Transfer: Min guard;BSC;RW;Ambulation (sit<>stand)   Toileting- Clothing Manipulation and Hygiene: Minimal assistance;Sit to/from stand   Tub/ Shower Transfer: Tub transfer;Min guard;Ambulation;3 in 1;Rolling walker   Functional mobility during ADLs: Min guard;Rolling walker General ADL Comments: Pt edcuated on compensatory techniques and AE for independence with ADLs including use of tongs and baby wipes for toilet hygiene. Pt performed tub transfer with Min guard.      Vision  Per pt report, no change from baseline.                   Perception Perception Perception Tested?: No   Praxis Praxis Praxis tested?: Within functional limits    Pertinent Vitals/Pain No c/o pain. Pt reports "pinching" sensation in L buttocks area.      Hand Dominance Right   Extremity/Trunk Assessment Upper Extremity Assessment Upper Extremity Assessment: Overall WFL for tasks assessed   Lower Extremity Assessment Lower Extremity Assessment: Defer to PT evaluation   Cervical / Trunk Assessment Cervical / Trunk Assessment: Normal   Communication Communication Communication: No difficulties   Cognition Arousal/Alertness: Awake/alert Behavior During Therapy: WFL for tasks assessed/performed Overall Cognitive Status: Within Functional Limits for tasks assessed                                Home Living Family/patient expects to be discharged to:: Private residence Living  Arrangements: Alone Available Help at Discharge: Friend(s);Available 24 hours/day (boyfriend is available to stay with her and help) Type of Home: House Home Access: Level entry     Home Layout: One level     Bathroom Shower/Tub: Tub/shower unit Shower/tub characteristics: Engineer, building servicesCurtain Bathroom Toilet: Standard     Home Equipment: None          Prior Functioning/Environment Level of Independence: Independent                                        End of Session Equipment Utilized During Treatment: Rolling walker;Back brace  Activity Tolerance: Patient tolerated treatment well Patient left: in chair;with call bell/phone within reach;with family/visitor present   Time: 1057-1120 OT Time Calculation (min): 23 min Charges:  OT General Charges $OT Visit: 1 Procedure OT Evaluation $Initial OT Evaluation Tier I: 1 Procedure OT Treatments $Self Care/Home Management : 8-22 mins  Rae LipsLeeann M Vadim Centola [phone] 06/11/2013, 1:03 PM

## 2013-06-11 NOTE — Evaluation (Signed)
Physical Therapy Evaluation Patient Details Name: Linda EhlersMary Rivera MRN: [id number] DOB: 1955/09/24 Today's Date: 06/11/2013   History of Present Illness  Pt is 58 y.o Female s/p posterior lumbar funsion 1 level for radiculopathy.   Clinical Impression  Pt very cautious and guarded with mobility, however anticipate great progress.  Pt ed on donning brace, but will need to review.  Will continue to follow.      Follow Up Recommendations Home health PT;Supervision - Intermittent    Equipment Recommendations  Rolling walker with 5" wheels;3in1 (PT)    Recommendations for Other Services       Precautions / Restrictions Precautions Precautions: Back Precaution Booklet Issued: Yes (comment) Precaution Comments: Educated pt and boyfriend on back precautions and incorporating into ADLs.  Restrictions Weight Bearing Restrictions: No      Mobility  Bed Mobility               General bed mobility comments: pt sitting EOB with Nsg on arrival.    Transfers Overall transfer level: Needs assistance Equipment used: Rolling walker (2 wheeled) Transfers: Sit to/from Stand Sit to Stand: Min guard         General transfer comment: cues for Ue use and controlling descent to sitting.  cues for safety with 3-in-1 transfer.    Ambulation/Gait Ambulation/Gait assistance: Min guard Ambulation Distance (Feet): 100 Feet Assistive device: Rolling walker (2 wheeled) Gait Pattern/deviations: Step-through pattern;Decreased stride length     General Gait Details: Initially attempted amb without AD, however pt much safer with RW.  pt has QC, will need to assess RW vs QC.    Stairs            Wheelchair Mobility    Modified Rankin (Stroke Patients Only)       Balance Overall balance assessment: Needs assistance         Standing balance support: During functional activity Standing balance-Leahy Scale: Fair Standing balance comment: pt able to stand for hand hygiene without UE  support, however unable to acept challenges to balance.                               Pertinent Vitals/Pain 4/10 low back.  Premedicated.      Home Living Family/patient expects to be discharged to:: Private residence Living Arrangements: Alone Available Help at Discharge: Friend(s);Available 24 hours/day (boyfriend is available to stay with her and help) Type of Home: House Home Access: Stairs to enter Entrance Stairs-Rails: None Entrance Stairs-Number of Steps: 1 Home Layout: One level Home Equipment: Cane - quad Additional Comments: pt told OT level entry, but told PT one step.      Prior Function Level of Independence: Independent               Hand Dominance   Dominant Hand: Right    Extremity/Trunk Assessment   Upper Extremity Assessment: Defer to OT evaluation           Lower Extremity Assessment: Overall WFL for tasks assessed      Cervical / Trunk Assessment: Normal  Communication   Communication: No difficulties  Cognition Arousal/Alertness: Awake/alert Behavior During Therapy: WFL for tasks assessed/performed Overall Cognitive Status: Within Functional Limits for tasks assessed                      General Comments      Exercises        Assessment/Plan  PT Assessment Patient needs continued PT services  PT Diagnosis Difficulty walking   PT Problem List Decreased activity tolerance;Decreased balance;Decreased mobility;Decreased knowledge of use of DME;Decreased knowledge of precautions  PT Treatment Interventions Gait training;Stair training;DME instruction;Functional mobility training;Therapeutic activities;Therapeutic exercise;Balance training;Patient/family education   PT Goals (Current goals can be found in the Care Plan section) Acute Rehab PT Goals Patient Stated Goal: Home PT Goal Formulation: With patient Time For Goal Achievement: 06/18/13 Potential to Achieve Goals: Good    Frequency Min 5X/week    Barriers to discharge Decreased caregiver support      Co-evaluation               End of Session Equipment Utilized During Treatment: Gait belt;Back brace Activity Tolerance: Patient tolerated treatment well Patient left: in chair;with call bell/phone within reach Nurse Communication: Mobility status         Time: [id number] PT Time Calculation (min): 29 min   Charges:   PT Evaluation $Initial PT Evaluation Tier I: 1 Procedure PT Treatments $Gait Training: 8-22 mins $Therapeutic Activity: 8-22 mins   PT G CodesSunny Schlein:          Kristine Tiley F Jawaan Adachi, [place] [phone] 06/11/2013, 1:42 PM

## 2013-06-11 NOTE — Progress Notes (Signed)
PO Day 1 S/P L4-5 Decompression and Fusion. Leg pain resolved, PO LBP very well controlled. Pt is thrilled with her progress. She is eating well and only complains of persistent cough, left cough medicine at home   BP 118/59  Pulse 87  Temp(Src) 99.2 F (37.3 C) (Oral)  Resp 16  SpO2 94% Pt sitting up on edge of bed eating breakfast. Dressing CDI, SCD's in place, drain in place with 25cc output over past 1.5hrs, 140 over 12hrs  PO Day 1 S/P L4-5 Decompression and Fusion  -Leg pain resolved   -LBP well controlled, cont PO meds percocet and valium   -Up with PT/OT today   -TLSO brace at all times when OOB except PM bathroom trips   -Back precautions  -Likely D/C home tomorrow post PT/OT pending progress

## 2013-06-11 NOTE — Op Note (Signed)
NAMBarbarann Ehlers:  Ruffini, Nychelle               ACCOUNT NO.:  [id number]  MEDICAL RECORD NO.:  [id number]  LOCATION:  [id number]                        FACILITY:  [facility]  PHYSICIAN:  Estill BambergMark Paxtyn Boyar, MD      DATE OF BIRTH:  October 15, 1955  DATE OF PROCEDURE:  06/10/2013                              OPERATIVE REPORT   PREOPERATIVE DIAGNOSES: 1. L4-5 spinal stenosis. 2. Significant left-sided radiculopathy. 3. L4-5 spondylolisthesis.  POSTOPERATIVE DIAGNOSES: 1. L4-5 spinal stenosis. 2. Significant left-sided radiculopathy. 3. L4-5 spondylolisthesis.  PROCEDURE: 1. Left-sided L4-5 transforaminal lumbar interbody fusion. 2. Right-sided L4-5 posterolateral fusion. 3. L4-5 bilateral partial facetectomy and decompression requiring more     bone removal and required for the interbody fusion aspect of the     procedure. 4. Placement of posterior instrumentation (7 mm EXPEDIUM screws). 5. Use of morselized allograft (DBX mix). 6. Use of local autograft. 7. Intraoperative use of fluoroscopy.  SURGEON:  Estill BambergMark Schae Cando, MD  ASSISTANT:  Jason CoopKayla McKenzie, PA-C  ANESTHESIA:  General endotracheal anesthesia.  COMPLICATIONS:  None.  DISPOSITION:  Stable.  ESTIMATED BLOOD LOSS:  300 mL, 150 mL of blood was re-transfused.  INDICATIONS FOR PROCEDURE:  Briefly, Ms. Gaynell FaceMarshall is a very pleasant 58- year-old female, who, I did initially evaluate on November 24, 2012, with severe left buttock and left leg pain.  I did review an MRI, which clearly was notable for L4-5 spinal stenosis.  She did also have a dynamic L4-5 spondylolisthesis.  She did also have ongoing pain and weakness in the left leg.  She did fail multiple forms of conservative care including physical therapy and epidural injections, but did continue to have pain.  We therefore did together decided to proceed with the procedure reflected above.  The patient did fully understand the risks and limitations of the procedure as outlined in my preoperative  note.  OPERATIVE DETAILS:  On Jun 10, 2013, the patient was brought to surgery and general endotracheal anesthesia was administered.  The patient was placed prone on a well-padded flat Jackson bed with a spinal frame.  All bony prominences were meticulously padded.  The back was prepped and draped in the usual sterile fashion.  Antibiotics were given and a time- out procedure was performed.  I then made a midline incision over, centered over the L4-5 intervertebral space.  A lateral intraoperative radiograph to confirm the appropriate operative level.  I then retracted the paraspinal musculature bilaterally.  The transverse processes of L4 and L5 were subperiosteally exposed.  Using anatomic landmarks to cannulate the L4 and L5 pedicles on the right and on the left side.  I then used a high-speed bur to decorticate the transverse processes of L4 and L5 on the right side in addition to the posterior elements on the right side.  I then placed 7 mm screws of the appropriate length on the right.  A 40-mm rod was then placed and distraction was applied across the rod.  Caps were placed and provisionally tightened.  On the left side, again, 7 mm screws were placed.  Of note, I did use a ball-tip probe to confirm there was no cortical violation of the cannulated pedicles bilaterally.  Once screws were  placed, rod was again applied and distraction was applied followed by caps and provisional tightening. I then performed a full facetectomy on the left side.  I then proceeded with bilateral lateral recess decompression.  This did require more bone and soft tissue removal and then required for the interbody fusion aspect of the procedure.  With an assistant holding medial retraction of the traversing L5 nerve, I did use a 15-blade knife to perform an annulotomy and I proceeded with a thorough and complete L4-5 diskectomy. I was very pleased with the diskectomy that I was able to accomplish. The  endplates were meticulously prepared.  I then placed a series of interbody spacer trials and I did feel the 10 x 27 mm spacer be the most appropriate fit.  The intervertebral space was then packed with autograft obtained from the decompression, in addition to allograft in the form of DBX mix.  The interbody implant was also packed and tamped into position in the usual fashion.  I did liberally use AP and lateral fluoroscopy and was very pleased with the appearance of the pedicle screws and the interbody implant.  Of note, the wound was copiously irrigated throughout the entirety of the procedure.  No extravasation of cerebrospinal fluid was noted.  I did use neurologic monitoring throughout the procedure, and there was no cyst and abnormal EMG activity noted throughout the surgery.  I then released the distraction bilaterally.  I then performed a locking procedure and the caps x4.  I then liberally packed the posterolateral gutter and the posterior elements on the right side with autograft and allograft to aid in the success of the fusion.  I then placed a #15 deep Blake drain, deep to the fascia.  The fascia was then closed using #1 Vicryl.  The subcutaneous layer was closed using 0 Vicryl followed by 2-0 Vicryl. The skin was closed using 3-0 Monocryl.  Benzoin and Steri-Strips were applied followed by sterile dressing.  All instrument counts were correct at the termination of the procedure.  Of note, Jason CoopKayla McKenzie was my assistant throughout the surgery, and did aid in retraction, suctioning, closure.     Estill BambergMark Emree Locicero, MD     MD/MEDQ  D:  06/10/2013  T:  06/11/2013  Job:  [id number]  cc:   Corinna CapraAngel Brown, DO

## 2013-06-12 NOTE — Progress Notes (Signed)
Physical Therapy Treatment Patient Details Name: Linda Rivera MRN: [id number] DOB: Jun 04, 1955 Today's Date: 06/12/2013    History of Present Illness Pt is 58 y.o Female s/p posterior lumbar funsion 1 level for radiculopathy.     PT Comments    Pt progressing with mobility.  Moves slowly & cautiously but is safe.  Pt increased ambulation distance & completed stair training this session.   Follow Up Recommendations  Home health PT;Supervision - Intermittent     Equipment Recommendations  Rolling walker with 5" wheels;3in1 (PT)    Recommendations for Other Services       Precautions / Restrictions Precautions Precautions: Back Precaution Comments: Reviewed back precautions.  Pt able to recall 2/3.   Restrictions Weight Bearing Restrictions: No    Mobility  Bed Mobility               General bed mobility comments: Pt sitting EOB upon arrival  Transfers Overall transfer level: Needs assistance Equipment used: Rolling walker (2 wheeled) Transfers: Sit to/from Stand Sit to Stand: Supervision         General transfer comment: cues to reinforce hand placement  Ambulation/Gait Ambulation/Gait assistance: Supervision Ambulation Distance (Feet): 120 Feet Assistive device: Rolling walker (2 wheeled) Gait Pattern/deviations: Step-through pattern;Decreased step length - right;Decreased step length - left;Decreased stride length Gait velocity: decreased   General Gait Details: Slow & cautious but steady.  Did not attempt ambulating with QC due to pt reports pain in Lt hip/LE.     Stairs Stairs: Yes Stairs assistance: Supervision Stair Management: Forwards;With walker Number of Stairs: 1 General stair comments: cues for technique  Wheelchair Mobility    Modified Rankin (Stroke Patients Only)       Balance                                    Cognition Arousal/Alertness: Awake/alert Behavior During Therapy: WFL for tasks  assessed/performed Overall Cognitive Status: Within Functional Limits for tasks assessed                      Exercises      General Comments        Pertinent Vitals/Pain C/o mild pain in Lt hip/LE.      Home Living                      Prior Function            PT Goals (current goals can now be found in the care plan section) Acute Rehab PT Goals Patient Stated Goal: Home PT Goal Formulation: With patient Time For Goal Achievement: 06/18/13 Potential to Achieve Goals: Good Progress towards PT goals: Progressing toward goals    Frequency  Min 5X/week    PT Plan Current plan remains appropriate    Co-evaluation             End of Session Equipment Utilized During Treatment: Back brace Activity Tolerance: Patient tolerated treatment well Patient left: in chair;with call bell/phone within reach     Time: 0959-1018 PT Time Calculation (min): 19 min  Charges:  $Gait Training: 8-22 mins                    G Codes:      Linda Rivera 06/12/2013, 12:35 PM   Linda Rivera, PTA [phone] 06/12/2013

## 2013-06-12 NOTE — Progress Notes (Signed)
PO Day 2 S/P L4-5 Decompression and Fusion. Leg pain resolved, PO LBP very well controlled. Pt is thrilled with her progress. She is eating well and only complains of persistent cough, left cough medicine at home. Has been up with PT/OT and making excellent progress, has learned to place brace.  BP 122/55  Pulse 88  Temp(Src) 101 F (38.3 C) (Oral)  Resp 18  SpO2 96%  Pt sitting up on edge of bed eating breakfast. Dressing CDI, SCD's in place, drain in place with 20cc output over past 8hrs, 75 over 24hrs, removed without difficulty  PO Day 2 S/P L4-5 Decompression and Fusion   -Leg pain resolved  -LBP well controlled, cont PO meds percocet and valium  -Up with PT/OT today   -TLSO brace at all times when OOB except PM bathroom trips   -Back precautions  -D/C home post PT/OT   -D/C orders printed in chart  -D/C scripts signed in chart  -F/U office 2 weeks

## 2013-06-15 NOTE — Discharge Summary (Signed)
Patient ID: Linda Rivera MRN: [id number] DOB/AGE: 02/22/1955 58 y.o.  Admit date: 06/10/2013 Discharge date: 06/12/2013  Admission Diagnoses:  Active Problems:   Radiculopathy   Discharge Diagnoses:  Same  Past Medical History  Diagnosis Date  . Bronchitis April 2015  . Arthritis   . Anxiety   . Diarrhea   . Birth defect     Right knee    Surgeries: Procedure(s): POSTERIOR LUMBAR FUSION 1 LEVEL L4-5 on 06/10/2013   Consultants:  None  Discharged Condition: Improved  Hospital Course: Linda Rivera is an 58 y.o. female who was admitted 06/10/2013 for operative treatment of radiculopathy. Patient has severe unremitting pain that affects sleep, daily activities, and work/hobbies. After pre-op clearance the patient was taken to the operating room on 06/10/2013 and underwent  Procedure(s): POSTERIOR LUMBAR FUSION 1 LEVEL L4-5.    Patient was given perioperative antibiotics:  Anti-infectives   Start     Dose/Rate Route Frequency Ordered Stop   06/10/13 1600  ceFAZolin (ANCEF) IVPB 1 g/50 mL premix     1 g 100 mL/hr over 30 Minutes Intravenous Every 8 hours 06/10/13 1412 06/11/13 0030   06/10/13 0600  ceFAZolin (ANCEF) IVPB 2 g/50 mL premix     2 g 100 mL/hr over 30 Minutes Intravenous On call to O.R. 06/09/13 1352 06/10/13 0755       Patient was given sequential compression devices, early ambulation to prevent DVT.  Patient benefited maximally from hospital stay and there were no complications.    Recent vital signs: BP 122/55  Pulse 88  Temp(Src) 101 F (38.3 C) (Oral)  Resp 18  SpO2 96%   Discharge Medications:     Medication List    STOP taking these medications       aspirin 81 MG chewable tablet     HYDROcodone-acetaminophen 5-325 MG per tablet  Commonly known as:  NORCO/VICODIN     phenyltoloxamine-acetaminophen 30-325 MG per tablet      TAKE these medications       clonazePAM 0.5 MG tablet  Commonly known as:  KLONOPIN  Take 0.5 mg by mouth 2 (two)  times daily as needed for anxiety.     diphenoxylate-atropine 2.5-0.025 MG per tablet  Commonly known as:  LOMOTIL  Take 1 tablet by mouth 4 (four) times daily as needed for diarrhea or loose stools.     gabapentin 300 MG capsule  Commonly known as:  NEURONTIN  Take 300 mg by mouth 3 (three) times daily.     sertraline 25 MG tablet  Commonly known as:  ZOLOFT  Take 25 mg by mouth daily.        Diagnostic Studies: Dg Chest 2 View  06/07/2013   CLINICAL DATA:  Preop for posterior lumbar fusion  EXAM: CHEST  2 VIEW  COMPARISON:  None.  FINDINGS: Cardiomediastinal silhouette is unremarkable. No acute infiltrate or pleural effusion. Mild degenerative changes mid thoracic spine. Mild degenerative changes bilateral shoulders. Central mild bronchitic changes probable chronic in nature.  IMPRESSION: No acute infiltrate or pulmonary edema. Probable chronic mild bronchitic changes.   Electronically Signed   By: Natasha MeadLiviu  Pop M.D.   On: 06/07/2013 10:11   Dg Lumbar Spine 2-3 Views  06/10/2013   CLINICAL DATA:  Intraoperative spinal localization radiographs  EXAM: LUMBAR SPINE - 2-3 VIEW  COMPARISON:  MR LUMBAR SPINE W/O CM dated 01/09/2013; DG LUMBAR SPINE 2-3 VIEWS dated 06/10/2013  FINDINGS: 3 spot intraoperative radiographic images of the lumbar spine a provided for review.  Spinal labeling is based on the presumption of 5 non rib-bearing lumbar type vertebral bodies.  Radiograph labeled #1 demonstrates radiopaque marking needle tips overlying the L2 and L3 spinous processes.  Radiograph labeled #2 demonstrates radiopaque marking needles tips overlying the soft tissues posterior to the L3 and L4 spinous processes.  Radiographs labeled #3 demonstrates radiopaque marking instrument tip overlying the soft tissues posterior the L4-L5 intervertebral disc space. Additional radiopaque surgical support apparatus is seen posterior to the operative site.  There is a linear radiopaque structure which overlies the lower  pelvis and may be pulled external to the patient.  IMPRESSION: Intraoperative localization of L4-L5.   Electronically Signed   By: Simonne ComeJohn  Watts M.D.   On: 06/10/2013 17:20   Dg Lumbar Spine 2-3 Views  06/10/2013   CLINICAL DATA:  L4-5 posterior fixation.  EXAM: LUMBAR SPINE - 2-3 VIEW; DG C-ARM 1-60 MIN  COMPARISON:  Plain films of same date and MRI of 01/09/2013.  FINDINGS: AP and lateral intraoperative views. These demonstrate trans pedicle screw fixation at L4-5. No acute hardware complication. Sponge projects over the L4-5 level on the AP view. Aortic atherosclerosis.  IMPRESSION: Intraoperative imaging of L4-5 fixation.   Electronically Signed   By: Jeronimo GreavesKyle  Talbot M.D.   On: 06/10/2013 13:12   Dg C-arm 1-60 Min  06/10/2013   CLINICAL DATA:  L4-5 posterior fixation.  EXAM: LUMBAR SPINE - 2-3 VIEW; DG C-ARM 1-60 MIN  COMPARISON:  Plain films of same date and MRI of 01/09/2013.  FINDINGS: AP and lateral intraoperative views. These demonstrate trans pedicle screw fixation at L4-5. No acute hardware complication. Sponge projects over the L4-5 level on the AP view. Aortic atherosclerosis.  IMPRESSION: Intraoperative imaging of L4-5 fixation.   Electronically Signed   By: Jeronimo GreavesKyle  Talbot M.D.   On: 06/10/2013 13:12    Disposition: 01-Home or Self Care    PO Day 2 S/P L4-5 Decompression and Fusion  -Leg pain resolved  -LBP well controlled, cont PO meds percocet and valium  -Up with PT/OT today  -TLSO brace at all times when OOB except PM bathroom trips  -Back precautions  -D/C home post PT/OT  -D/C orders printed in chart  -D/C scripts signed in chart  -F/U office 2 weeks   Signed: Eilene GhaziKayla J Elfreda Blanchet 06/15/2013, 1:26 PM

## 2013-07-30 NOTE — OR Nursing (Signed)
Addendum to scope page 

## 2015-08-19 IMAGING — CR DG LUMBAR SPINE 2-3V
3 series · 3 of 3 positions shown · non-contrast
Comparison: MR LUMBAR SPINE W/O CM dated 01/09/2013; DG LUMBAR SPINE
2-3 VIEWS dated 06/10/2013

CLINICAL DATA: Intraoperative spinal localization radiographs

EXAM:
LUMBAR SPINE - 2-3 VIEW

[lateral (1 of 3)]
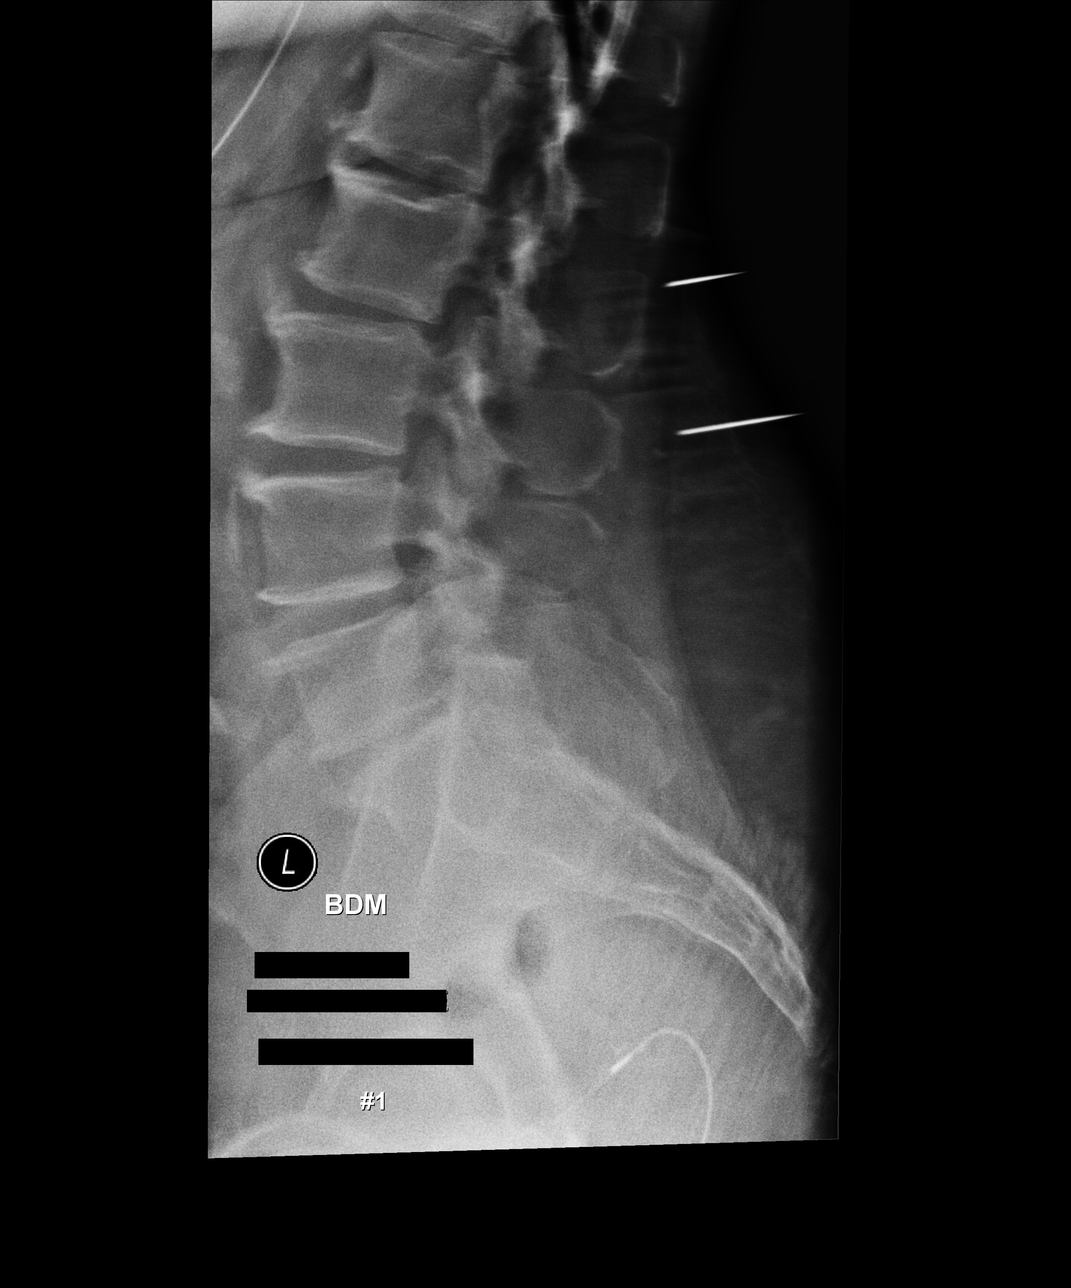

[lateral (2 of 3)]
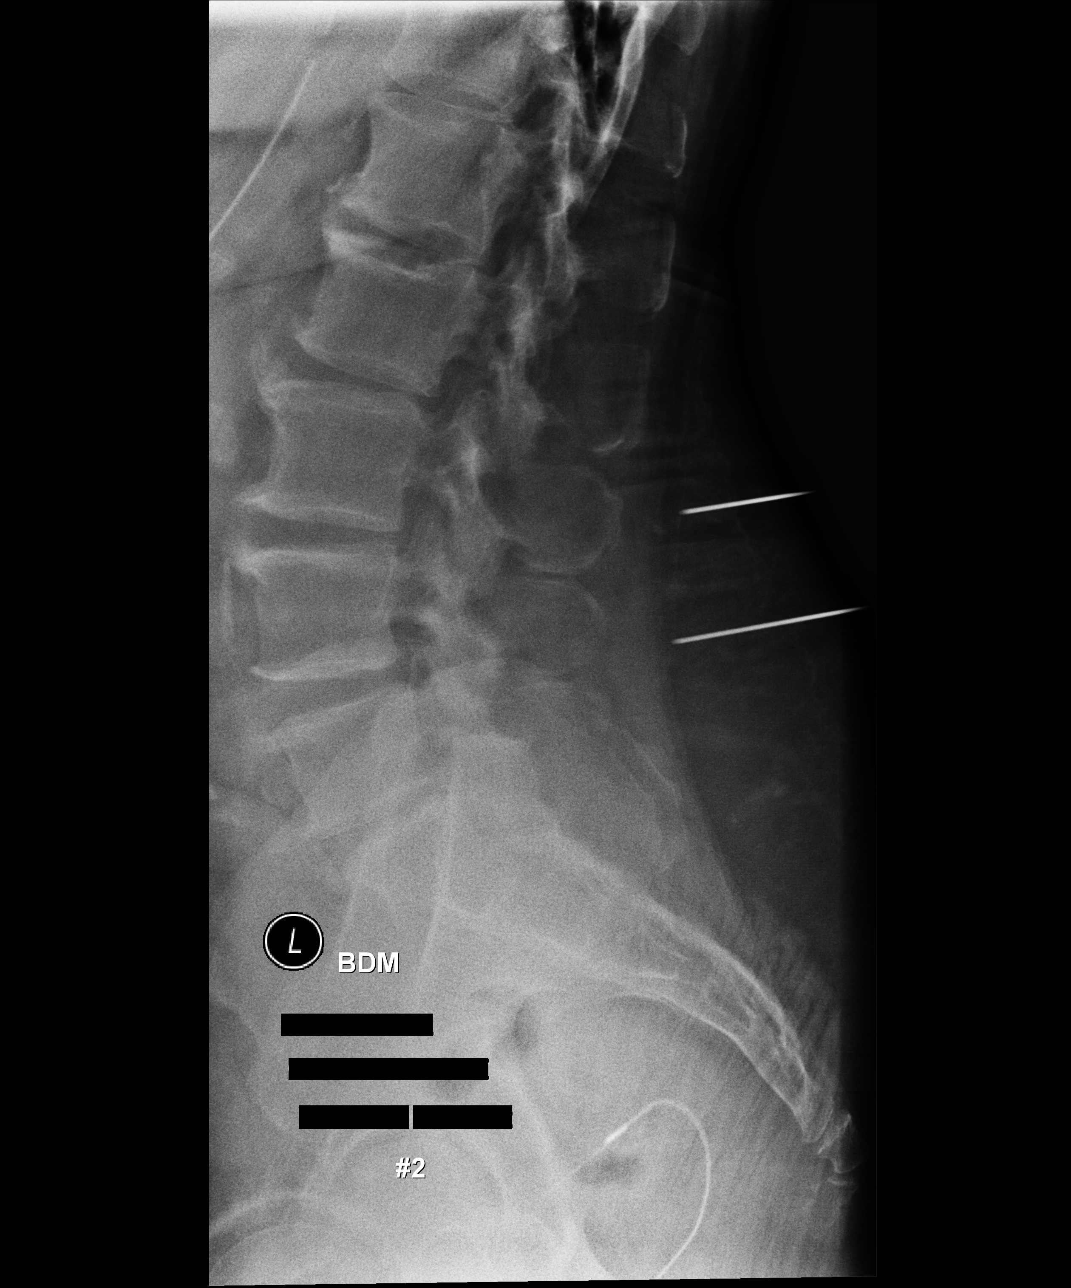

[lateral (3 of 3)]
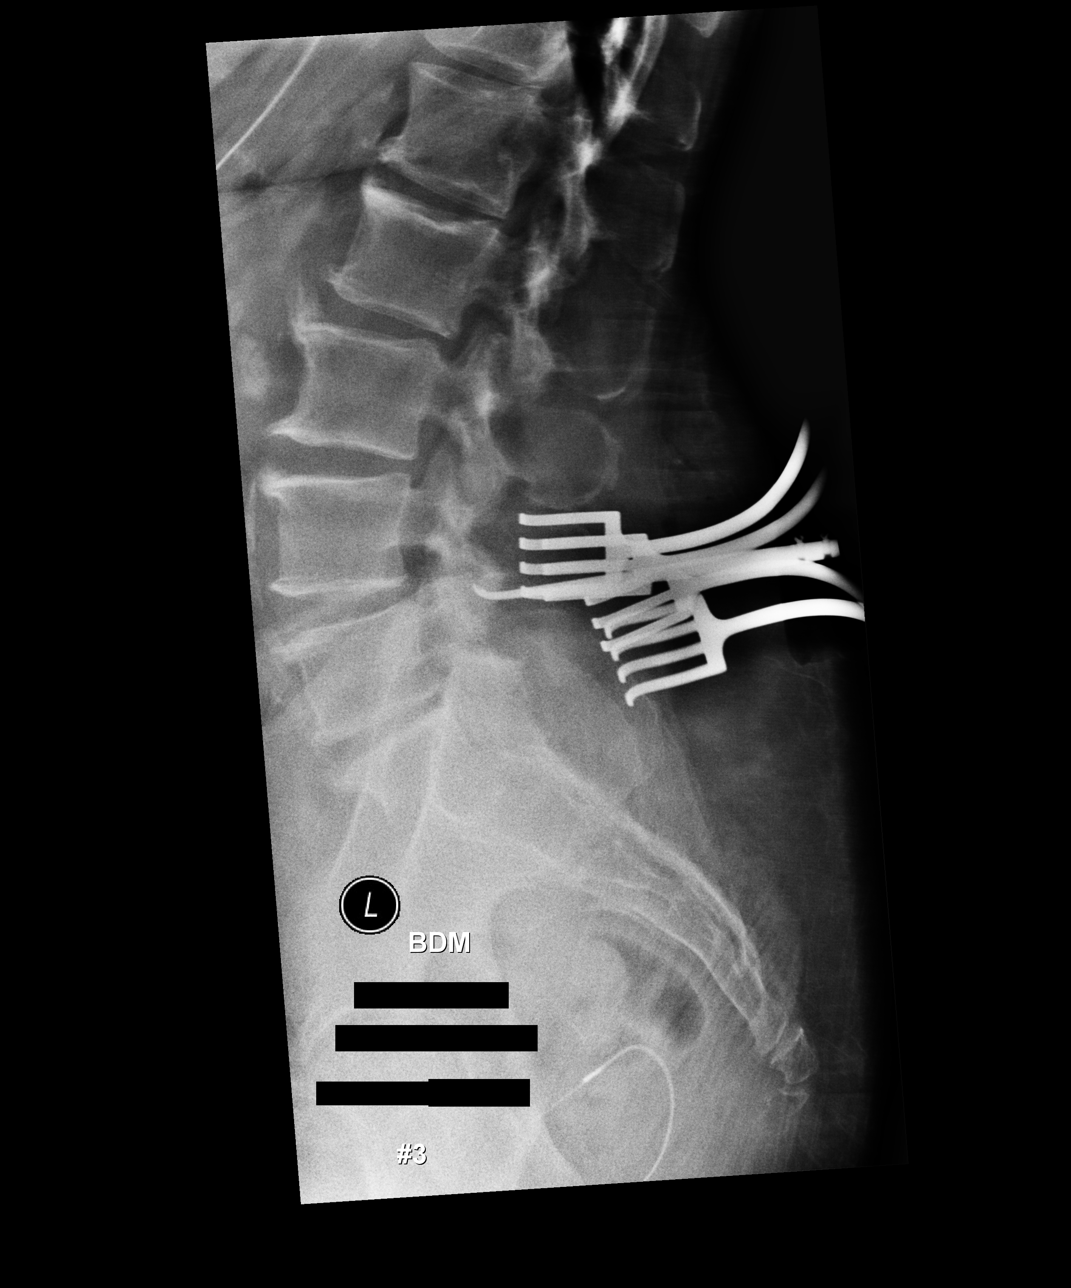

[3 of 3 positions shown; findings below may reference images not displayed]

FINDINGS: 3 spot intraoperative radiographic images of the lumbar spine a
provided for review.

Spinal labeling is based on the presumption of 5 non rib-bearing
lumbar type vertebral bodies.

Radiograph labeled #1 demonstrates radiopaque marking needle tips
overlying the L2 and L3 spinous processes.

Radiograph labeled #2 demonstrates radiopaque marking needles tips
overlying the soft tissues posterior to the L3 and L4 spinous
processes.

Radiographs labeled #3 demonstrates radiopaque marking instrument
tip overlying the soft tissues posterior the L4-L5 intervertebral
disc space. Additional radiopaque surgical support apparatus is seen
posterior to the operative site.

There is a linear radiopaque structure which overlies the lower
pelvis and may be pulled external to the patient.
IMPRESSION: Intraoperative localization of L4-L5.

## 2022-02-04 DEATH — deceased
